# Patient Record
Sex: Female | Born: 1995 | Race: Black or African American | Hispanic: No | Marital: Single | Smoking: Current some day smoker
Health system: Southern US, Community
[De-identification: ages and names within clinical notes are randomized; demographics above are authoritative.]

## PROBLEM LIST (undated history)

## (undated) DIAGNOSIS — J45909 Unspecified asthma, uncomplicated: Secondary | ICD-10-CM

## (undated) DIAGNOSIS — R011 Cardiac murmur, unspecified: Secondary | ICD-10-CM

---
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
## 2021-01-16 ENCOUNTER — Other Ambulatory Visit: Payer: Self-pay

## 2021-01-16 ENCOUNTER — Emergency Department (HOSPITAL_COMMUNITY)
Admission: EM | Admit: 2021-01-16 | Discharge: 2021-01-16 | Disposition: A | Discharge status: Home / Self Care | Payer: Self-pay | Attending: Emergency Medicine | Admitting: Emergency Medicine

## 2021-01-16 ENCOUNTER — Telehealth (HOSPITAL_COMMUNITY): Payer: Self-pay | Admitting: Emergency Medicine

## 2021-01-16 ENCOUNTER — Encounter (HOSPITAL_COMMUNITY): Payer: Self-pay

## 2021-01-16 ENCOUNTER — Emergency Department (HOSPITAL_COMMUNITY): Payer: Self-pay

## 2021-01-16 DIAGNOSIS — W540XXA Bitten by dog, initial encounter: Secondary | ICD-10-CM | POA: Insufficient documentation

## 2021-01-16 DIAGNOSIS — S61451A Open bite of right hand, initial encounter: Secondary | ICD-10-CM | POA: Insufficient documentation

## 2021-01-16 DIAGNOSIS — F17290 Nicotine dependence, other tobacco product, uncomplicated: Secondary | ICD-10-CM | POA: Insufficient documentation

## 2021-01-16 MED ORDER — AMOXICILLIN-POT CLAVULANATE 875-125 MG PO TABS: 1.0000 | ORAL_TABLET | Freq: Two times a day (BID) | ORAL | 0 refills | Status: DC

## 2021-01-16 MED ORDER — AMOXICILLIN-POT CLAVULANATE 875-125 MG PO TABS
1.0000 | ORAL_TABLET | Freq: Once | ORAL | Status: AC
  Administered 2021-01-16: 1 via ORAL
  Filled 2021-01-16: qty 1

## 2021-01-16 MED ORDER — OXYCODONE-ACETAMINOPHEN 5-325 MG PO TABS
1.0000 | ORAL_TABLET | Freq: Once | ORAL | Status: AC
  Administered 2021-01-16: 1 via ORAL
  Filled 2021-01-16: qty 1

## 2021-01-16 MED ORDER — BACITRACIN ZINC 500 UNIT/GM EX OINT
TOPICAL_OINTMENT | CUTANEOUS | Status: AC
  Filled 2021-01-16: qty 0.9

## 2021-01-16 MED ORDER — AMOXICILLIN-POT CLAVULANATE 875-125 MG PO TABS: 1.0000 | ORAL_TABLET | Freq: Two times a day (BID) | ORAL | 0 refills | Status: AC

## 2021-01-16 MED ORDER — LIDOCAINE-EPINEPHRINE 2 %-1:100000 IJ SOLN
20.0000 mL | Freq: Once | INTRAMUSCULAR | Status: AC
  Administered 2021-01-16: 20 mL
  Filled 2021-01-16: qty 1

## 2021-01-16 NOTE — Telephone Encounter (Signed)
This patient returns to the emergency department saying that her prescription was not signed or excepted by the pharmacy.  She was seen overnight for dog bite, the prescription of Augmentin is appropriate.  Nurse Abby spoke directly with the patient.  I was able to resend a prescription of Augmentin electronically to the [facility] of her choice.  Patient was educated about this medication.

## 2021-01-16 NOTE — ED Provider Notes (Signed)
[facility]-EMERGENCY DEPT Provider Note   CSN: [id number] Arrival date & time: 01/16/21  0431     History Chief Complaint  Patient presents with  . Animal Bite    Emily Manning is a 25 y.o. female.  HPI     This is a 25 year old female who presents with a dog bite.  Patient reports that she was trying to get a toy out of her dog's mouth when her dog bit her on her right hand.  She is right-hand dominant.  Dog is up-to-date on his vaccinations including rabies.  Patient reports she has had a tetanus shot within the last 5 years.  She is reporting 8 out of 10 pain in the right hand.  No other injury.  History reviewed. No pertinent past medical history.  There are no problems to display for this patient.   History reviewed. No pertinent surgical history.   OB History   No obstetric history on file.     No family history on file.  Social History   Tobacco Use  . Smoking status: Current Some Day Smoker    Types: Cigars  . Smokeless tobacco: Never Used  Substance Use Topics  . Alcohol use: Not Currently  . Drug use: Yes    Types: Marijuana    Home Medications Prior to Admission medications   Medication Sig Start Date End Date Taking? Authorizing Provider  amoxicillin-clavulanate (AUGMENTIN) 875-125 MG tablet Take 1 tablet by mouth every 12 (twelve) hours. 01/16/21  Yes Emilly Lavey, Mayer Masker, MD  cyclobenzaprine (FLEXERIL) 5 MG tablet Take 1 tablet (5 mg total) by mouth at bedtime as needed for muscle spasms. 02/14/15   Rodolph Bong, MD  naproxen (NAPROSYN) 375 MG tablet Take 1 tablet (375 mg total) by mouth 2 (two) times daily. 02/14/15   Rodolph Bong, MD    Allergies    Patient has no known allergies.  Review of Systems   Review of Systems  Musculoskeletal:       Hand pain  Skin: Positive for wound.  Neurological: Negative for weakness and numbness.  All other systems reviewed and are negative.   Physical Exam Updated Vital  Signs BP (!) 112/100 (BP Location: Left Arm)   Pulse 64   Temp 98.2 F (36.8 C) (Oral)   Resp 18   Ht 1.524 m (5')   Wt 94.8 kg   LMP 12/19/2020   SpO2 100%   BMI 40.82 kg/m   Physical Exam Vitals and nursing note reviewed.  Constitutional:      Appearance: She is well-developed. She is obese.  HENT:     Head: Normocephalic and atraumatic.  Eyes:     Pupils: Pupils are equal, round, and reactive to light.  Cardiovascular:     Rate and Rhythm: Normal rate and regular rhythm.  Pulmonary:     Effort: Pulmonary effort is normal. No respiratory distress.  Musculoskeletal:     Cervical back: Neck supple.     Comments: Focused examination of the right hand swelling of the thumb and thenar eminence, flexion and extension of the thumb intact  Skin:    General: Skin is warm and dry.     Comments: 4 cm bite/laceration over the thenar eminence with subcu tissue exposed, no foreign body punctate bite mark over the dorsum of the thumb at the MCP, no foreign body palpated, superficial bite/abrasion over the dorsum of the thumb including the base of the nail, there is a small subungual  hematoma  Neurological:     Mental Status: She is alert and oriented to person, place, and time.  Psychiatric:        Mood and Affect: Mood normal.     ED Results / Procedures / Treatments   Labs (all labs ordered are listed, but only abnormal results are displayed) Labs Reviewed - No data to display  EKG None  Radiology DG Hand Complete Right  Result Date: 01/16/2021 CLINICAL DATA:  25 year old female status post dog bite this morning. Puncture wounds to posterior and palmar surface near base of thumb. EXAM: RIGHT HAND - COMPLETE 3+ VIEW COMPARISON:  None. FINDINGS: Soft tissue swelling and heterogeneity with epicenter in the webspace between the 1st and 2nd metacarpals. No radiopaque foreign body identified. No tracking soft tissue gas. Underlying normal bone mineralization. Joint spaces appear  normal. No fracture or dislocation. No osseous abnormality identified. IMPRESSION: Soft tissue swelling/injury with no radiopaque foreign body or osseous abnormality identified. Electronically Signed   By: Odessa Fleming M.D.   On: 01/16/2021 05:13    Procedures .Marland KitchenLaceration Repair  Date/Time: 01/16/2021 5:41 AM Performed by: Shon Baton, MD Authorized by: Shon Baton, MD   Consent:    Consent obtained:  Verbal   Consent given by:  Patient   Risks discussed:  Infection, pain and poor cosmetic result   Alternatives discussed:  No treatment Anesthesia:    Anesthesia method:  Local infiltration   Local anesthetic:  Lidocaine 2% WITH epi Laceration details:    Location:  Hand   Hand location:  R palm   Length (cm):  4   Depth (mm):  5 Pre-procedure details:    Preparation:  Patient was prepped and draped in usual sterile fashion Exploration:    Limited defect created (wound extended): no     Hemostasis achieved with:  Direct pressure   Imaging outcome: foreign body noted     Wound exploration: wound explored through full range of motion and entire depth of wound visualized     Wound extent: no foreign bodies/material noted, no nerve damage noted and no tendon damage noted     Contaminated: no   Treatment:    Area cleansed with:  Povidone-iodine   Amount of cleaning:  Extensive   Irrigation solution:  Sterile saline   Visualized foreign bodies/material removed: no     Debridement:  Minimal   Undermining:  None   Scar revision: no   Skin repair:    Repair method:  Sutures   Suture size:  4-0   Suture material:  Nylon   Suture technique:  Simple interrupted   Number of sutures:  3 Approximation:    Approximation:  Loose Repair type:    Repair type:  Simple Post-procedure details:    Dressing:  Antibiotic ointment   Procedure completion:  Tolerated     Medications Ordered in ED Medications  oxyCODONE-acetaminophen (PERCOCET/ROXICET) 5-325 MG per tablet 1 tablet  (1 tablet Oral Given 01/16/21 0456)  amoxicillin-clavulanate (AUGMENTIN) 875-125 MG per tablet 1 tablet (1 tablet Oral Given 01/16/21 0456)  lidocaine-EPINEPHrine (XYLOCAINE W/EPI) 2 %-1:100000 (with pres) injection 20 mL (20 mLs Infiltration Given by Other 01/16/21 3875)    ED Course  I have reviewed the triage vital signs and the nursing notes.  Pertinent labs & imaging results that were available during my care of the patient were reviewed by me and considered in my medical decision making (see chart for details).    MDM Rules/Calculators/A&P  Patient presents with a dog bite to her dominant hand.  This is her own animal and it appears to be a provoked bite by taking away one of the dog's toys.  Reports animal is up-to-date on rabies.  She is up-to-date on tetanus.  Largest bite is over the thenar eminence of the hand.  Flexion and extension of the thumb intact.  Bites were soaked in saline and Betadine.  They were copiously irrigated.  Laceration repair of the bite on the thenar eminence was pursued given extent.  This was loosely approximated.  Otherwise all wounds were clean.  Patient advised to monitor closely for signs and symptoms of infection.  To be discharged with Augmentin.  After history, exam, and medical workup I feel the patient has been appropriately medically screened and is safe for discharge home. Pertinent diagnoses were discussed with the patient. Patient was given return precautions.  Final Clinical Impression(s) / ED Diagnoses Final diagnoses:  Dog bite, initial encounter    Rx / DC Orders ED Discharge Orders         Ordered    amoxicillin-clavulanate (AUGMENTIN) 875-125 MG tablet  Every 12 hours        01/16/21 0537           Cloyce Blankenhorn, Mayer Masker, MD 01/16/21 [phone]

## 2021-01-16 NOTE — ED Triage Notes (Signed)
Pt reports her dog bit her this morning. All vaccines up to date. Puncture wounds on right hand.

## 2021-01-16 NOTE — Discharge Instructions (Signed)
You were seen today for dog bite.  Monitor closely for signs and symptoms of infection including redness, drainage from the sites.  You will need removal of your stitches in 7 to 10 days.  Apply antibiotic ointment and do not submerge.

## 2021-02-04 ENCOUNTER — Encounter (HOSPITAL_COMMUNITY): Payer: Self-pay

## 2021-02-04 ENCOUNTER — Other Ambulatory Visit: Payer: Self-pay

## 2021-02-04 ENCOUNTER — Ambulatory Visit (HOSPITAL_COMMUNITY)
Admission: EM | Admit: 2021-02-04 | Discharge: 2021-02-04 | Disposition: A | Discharge status: Home / Self Care | Payer: Self-pay | Attending: Emergency Medicine | Admitting: Emergency Medicine

## 2021-02-04 DIAGNOSIS — S61451D Open bite of right hand, subsequent encounter: Secondary | ICD-10-CM

## 2021-02-04 DIAGNOSIS — W540XXD Bitten by dog, subsequent encounter: Secondary | ICD-10-CM

## 2021-02-04 MED ORDER — AMOXICILLIN-POT CLAVULANATE 875-125 MG PO TABS: 1.0000 | ORAL_TABLET | Freq: Two times a day (BID) | ORAL | 0 refills | Status: DC

## 2021-02-04 NOTE — ED Provider Notes (Signed)
[facility]    CSN: [id number] Arrival date & time: 02/04/21  1348      History   Chief Complaint Chief Complaint  Patient presents with  . Wound Check    HPI Emily Manning is a 25 y.o. female.   Patient presents with swelling and pain to right thumb for three days. Had dog bite on 01/16/21. Received treatment and sutures on 4/6. Sutures still present. Did not complete antibiotics. Unable to bend thumb. Denies numbness and tingling.  History reviewed. No pertinent past medical history.  There are no problems to display for this patient.   History reviewed. No pertinent surgical history.  OB History   No obstetric history on file.      Home Medications    Prior to Admission medications   Medication Sig Start Date End Date Taking? Authorizing Provider  amoxicillin-clavulanate (AUGMENTIN) 875-125 MG tablet Take 1 tablet by mouth every 12 (twelve) hours. 02/04/21  Yes Tayshun Gappa, Elita Boone, NP  cyclobenzaprine (FLEXERIL) 5 MG tablet Take 1 tablet (5 mg total) by mouth at bedtime as needed for muscle spasms. 02/14/15   Rodolph Bong, MD  naproxen (NAPROSYN) 375 MG tablet Take 1 tablet (375 mg total) by mouth 2 (two) times daily. 02/14/15   Rodolph Bong, MD    Family History History reviewed. No pertinent family history.  Social History Social History   Tobacco Use  . Smoking status: Current Some Day Smoker    Types: Cigars  . Smokeless tobacco: Never Used  Substance Use Topics  . Alcohol use: Not Currently  . Drug use: Yes    Types: Marijuana     Allergies   Patient has no known allergies.   Review of Systems Review of Systems  Constitutional: Negative.   Respiratory: Negative.   Cardiovascular: Negative.   Musculoskeletal: Positive for joint swelling. Negative for arthralgias, back pain, gait problem, myalgias, neck pain and neck stiffness.  Skin: Negative.   Neurological: Negative.      Physical Exam Triage Vital Signs ED Triage Vitals   Enc Vitals Group     BP 02/04/21 1448 114/68     Pulse Rate 02/04/21 1448 84     Resp 02/04/21 1448 18     Temp 02/04/21 1448 98.6 F (37 C)     Temp Source 02/04/21 1448 Oral     SpO2 02/04/21 1448 96 %     Weight --      Height --      Head Circumference --      Peak Flow --      Pain Score 02/04/21 1447 6     Pain Loc --      Pain Edu? --      Excl. in GC? --    No data found.  Updated Vital Signs BP 114/68 (BP Location: Left Arm)   Pulse 84   Temp 98.6 F (37 C) (Oral)   Resp 18   LMP 01/21/2021 (Exact Date)   SpO2 96%   Visual Acuity Right Eye Distance:   Left Eye Distance:   Bilateral Distance:    Right Eye Near:   Left Eye Near:    Bilateral Near:     Physical Exam Constitutional:      Appearance: Normal appearance. She is normal weight.  HENT:     Head: Normocephalic.  Eyes:     Extraocular Movements: Extraocular movements intact.  Pulmonary:     Effort: Pulmonary effort is normal.  Musculoskeletal:  Comments: Unable to bend right thumb and mildly swollen. Tenderness over sutures, no erythema, no edema, no drainage present    Skin:    General: Skin is warm and dry.  Neurological:     General: No focal deficit present.     Mental Status: She is alert and oriented to person, place, and time. Mental status is at baseline.  Psychiatric:        Mood and Affect: Mood normal.        Behavior: Behavior normal.        Thought Content: Thought content normal.        Judgment: Judgment normal.      UC Treatments / Results  Labs (all labs ordered are listed, but only abnormal results are displayed) Labs Reviewed - No data to display  EKG   Radiology No results found.  Procedures Procedures (including critical care time)  Medications Ordered in UC Medications - No data to display  Initial Impression / Assessment and Plan / UC Course  I have reviewed the triage vital signs and the nursing notes.  Pertinent labs & imaging results that  were available during my care of the patient were reviewed by me and considered in my medical decision making (see chart for details).  Dog bite, subsequent encounter   1. Augment 875/125 bid for 7 days, discussed importance of completing antibiotic, verbalized understanding 2. Continue use of otc ibuprofen for pain management 3. Given Hand specialist referral due to decreased ROM after three weeks 4. Sutures removed, no signs of infection at wound site  Final Clinical Impressions(s) / UC Diagnoses   Final diagnoses:  None     Discharge Instructions     Take augmentin twice a day for 7 days, take all medication  Continue use of ibuprofen and tylenol for pain   Follow up with hand specialist for evaluation    ED Prescriptions    Medication Sig Dispense Auth. Provider   amoxicillin-clavulanate (AUGMENTIN) 875-125 MG tablet Take 1 tablet by mouth every 12 (twelve) hours. 14 tablet Veida Spira, Elita Boone, NP     PDMP not reviewed this encounter.   Valinda Hoar, NP 02/04/21 1625

## 2021-02-04 NOTE — ED Triage Notes (Signed)
Pt presents to check the wound in the right right hand and right thumb after dog bite on 01/16/2021. Pt reports swelling and pain in the right hand x 3 days.

## 2021-02-04 NOTE — Discharge Instructions (Addendum)
Take augmentin twice a day for 7 days, take all medication  Continue use of ibuprofen and tylenol for pain   Follow up with hand specialist for evaluation

## 2021-02-04 NOTE — ED Notes (Signed)
Removed 3 sutures from left hand (thumb area)

## 2022-10-27 ENCOUNTER — Encounter (HOSPITAL_COMMUNITY): Payer: Self-pay | Admitting: Emergency Medicine

## 2022-10-27 ENCOUNTER — Ambulatory Visit (HOSPITAL_COMMUNITY)
Admission: EM | Admit: 2022-10-27 | Discharge: 2022-10-27 | Disposition: A | Discharge status: Home / Self Care | Payer: Self-pay | Attending: Emergency Medicine | Admitting: Emergency Medicine

## 2022-10-27 DIAGNOSIS — B349 Viral infection, unspecified: Secondary | ICD-10-CM

## 2022-10-27 DIAGNOSIS — J029 Acute pharyngitis, unspecified: Secondary | ICD-10-CM

## 2022-10-27 HISTORY — DX: Unspecified asthma, uncomplicated: J45.909

## 2022-10-27 HISTORY — DX: Cardiac murmur, unspecified: R01.1

## 2022-10-27 MED ORDER — PREDNISONE 10 MG (21) PO TBPK: ORAL_TABLET | Freq: Every day | ORAL | 0 refills | Status: DC

## 2022-10-27 MED ORDER — FLUTICASONE PROPIONATE 50 MCG/ACT NA SUSP: 2.0000 | Freq: Every day | NASAL | 2 refills | Status: DC

## 2022-10-27 MED ORDER — NAPROXEN 375 MG PO TABS: 375.0000 mg | ORAL_TABLET | Freq: Two times a day (BID) | ORAL | 0 refills | Status: DC

## 2022-10-27 NOTE — Discharge Instructions (Addendum)
Take over-the-counter cough and cold medicines such as DayQuil and NyQuil Your symptoms are more viral in nature such as a cold Antibiotics are not needed at this time use a humidifier to help with congestion

## 2022-10-27 NOTE — ED Provider Notes (Signed)
[facility]    CSN: [id number] Arrival date & time: 10/27/22  1106      History   Chief Complaint Chief Complaint  Patient presents with   Sore Throat   Emesis    HPI Emily Manning is a 26 y.o. female.   Patient presents today with sore throat  congestion for the past 3 days.  Denies any shortness of breath no chest pain.  Unknown of a fever.  Taking over-the-counter elderberry and honey with minimal relief.  Patient is eating and drinking well.    Past Medical History:  Diagnosis Date   Asthma    Heart murmur     There are no problems to display for this patient.   History reviewed. No pertinent surgical history.  OB History   No obstetric history on file.      Home Medications    Prior to Admission medications   Medication Sig Start Date End Date Taking? Authorizing Provider  fluticasone (FLONASE) 50 MCG/ACT nasal spray Place 2 sprays into both nostrils daily. 10/27/22  Yes Marney Setting, NP  predniSONE (STERAPRED UNI-PAK 21 TAB) 10 MG (21) TBPK tablet Take by mouth daily. Take 6 tabs by mouth daily  for 2 days, then 5 tabs for 2 days, then 4 tabs for 2 days, then 3 tabs for 2 days, 2 tabs for 2 days, then 1 tab by mouth daily for 2 days 10/27/22  Yes Marney Setting, NP  amoxicillin-clavulanate (AUGMENTIN) 875-125 MG tablet Take 1 tablet by mouth every 12 (twelve) hours. 02/04/21   White, Leitha Schuller, NP  cyclobenzaprine (FLEXERIL) 5 MG tablet Take 1 tablet (5 mg total) by mouth at bedtime as needed for muscle spasms. 02/14/15   Gregor Hams, MD  naproxen (NAPROSYN) 375 MG tablet Take 1 tablet (375 mg total) by mouth 2 (two) times daily. 10/27/22   Marney Setting, NP    Family History History reviewed. No pertinent family history.  Social History Social History   Tobacco Use   Smoking status: Some Days    Types: Cigars   Smokeless tobacco: Never  Substance Use Topics   Alcohol use: Not Currently   Drug use: Yes    Types:  Marijuana     Allergies   Patient has no known allergies.   Review of Systems Review of Systems  Constitutional:  Negative for chills and fever.  HENT:  Positive for congestion, postnasal drip, rhinorrhea, sinus pressure, sinus pain and sore throat. Negative for ear pain, facial swelling and trouble swallowing.   Eyes: Negative.   Respiratory: Negative.    Cardiovascular: Negative.   Gastrointestinal: Negative.   Genitourinary: Negative.   Musculoskeletal: Negative.   Neurological: Negative.      Physical Exam Triage Vital Signs ED Triage Vitals [10/27/22 1251]  Enc Vitals Group     BP 130/86     Pulse Rate 82     Resp 16     Temp 98 F (36.7 C)     Temp Source Oral     SpO2 98 %     Weight      Height      Head Circumference      Peak Flow      Pain Score 5     Pain Loc      Pain Edu?      Excl. in [place]?    No data found.  Updated Vital Signs BP 130/86 (BP Location: Left Arm)  Pulse 82   Temp 98 F (36.7 C) (Oral)   Resp 16   LMP 10/13/2022   SpO2 98%   Visual Acuity Right Eye Distance:   Left Eye Distance:   Bilateral Distance:    Right Eye Near:   Left Eye Near:    Bilateral Near:     Physical Exam Constitutional:      Appearance: She is well-developed.  HENT:     Right Ear: Tympanic membrane normal.     Left Ear: Tympanic membrane normal.     Nose: Congestion and rhinorrhea present.     Mouth/Throat:     Mouth: Mucous membranes are moist. No oral lesions.     Pharynx: Posterior oropharyngeal erythema present. No oropharyngeal exudate or uvula swelling.     Tonsils: No tonsillar exudate or tonsillar abscesses. 1+ on the right. 1+ on the left.  Eyes:     Conjunctiva/sclera: Conjunctivae normal.  Cardiovascular:     Rate and Rhythm: Normal rate.  Pulmonary:     Effort: Pulmonary effort is normal.  Abdominal:     Palpations: Abdomen is soft.  Musculoskeletal:     Cervical back: Normal range of motion.  Skin:    General: Skin is warm.   Neurological:     Mental Status: She is alert.      UC Treatments / Results  Labs (all labs ordered are listed, but only abnormal results are displayed) Labs Reviewed - No data to display  EKG   Radiology No results found.  Procedures Procedures (including critical care time)  Medications Ordered in UC Medications - No data to display  Initial Impression / Assessment and Plan / UC Course  I have reviewed the triage vital signs and the nursing notes.  Pertinent labs & imaging results that were available during my care of the patient were reviewed by me and considered in my medical decision making (see chart for details).     Patient has symptoms more viral in nature no antibiotic is needed at this time Patient should take over-the-counter DayQuil NyQuil as needed Use a humidifier to help with nasal congestion Symptoms may persist for several days  Final Clinical Impressions(s) / UC Diagnoses   Final diagnoses:  Sore throat  Viral illness     Discharge Instructions      Take over-the-counter cough and cold medicines such as DayQuil and NyQuil Your symptoms are more viral in nature such as a cold Antibiotics are not needed at this time use a humidifier to help with congestion      ED Prescriptions     Medication Sig Dispense Auth. Provider   fluticasone (FLONASE) 50 MCG/ACT nasal spray Place 2 sprays into both nostrils daily. 16 g Morley Kos L, NP   naproxen (NAPROSYN) 375 MG tablet Take 1 tablet (375 mg total) by mouth 2 (two) times daily. 20 tablet Morley Kos L, NP   predniSONE (STERAPRED UNI-PAK 21 TAB) 10 MG (21) TBPK tablet Take by mouth daily. Take 6 tabs by mouth daily  for 2 days, then 5 tabs for 2 days, then 4 tabs for 2 days, then 3 tabs for 2 days, 2 tabs for 2 days, then 1 tab by mouth daily for 2 days 42 tablet Marney Setting, NP      PDMP not reviewed this encounter.   Marney Setting, NP 10/27/22 1308

## 2022-10-27 NOTE — ED Triage Notes (Signed)
Reports sore throat, nausea, vomiting, nasal drainage, possible fever x 3 days Co-worker sick recently. Denies cough Taking elderberry and honey at home with minimal improvement of symptoms.

## 2023-02-08 IMAGING — CR DG HAND COMPLETE 3+V*R*
3 series · 3 of 3 positions shown · non-contrast
Comparison: None.

CLINICAL DATA: 24-year-old female status post dog bite this
morning. Puncture wounds to posterior and palmar surface near base
of thumb.

EXAM:
RIGHT HAND - COMPLETE 3+ VIEW

[x hand pa right]
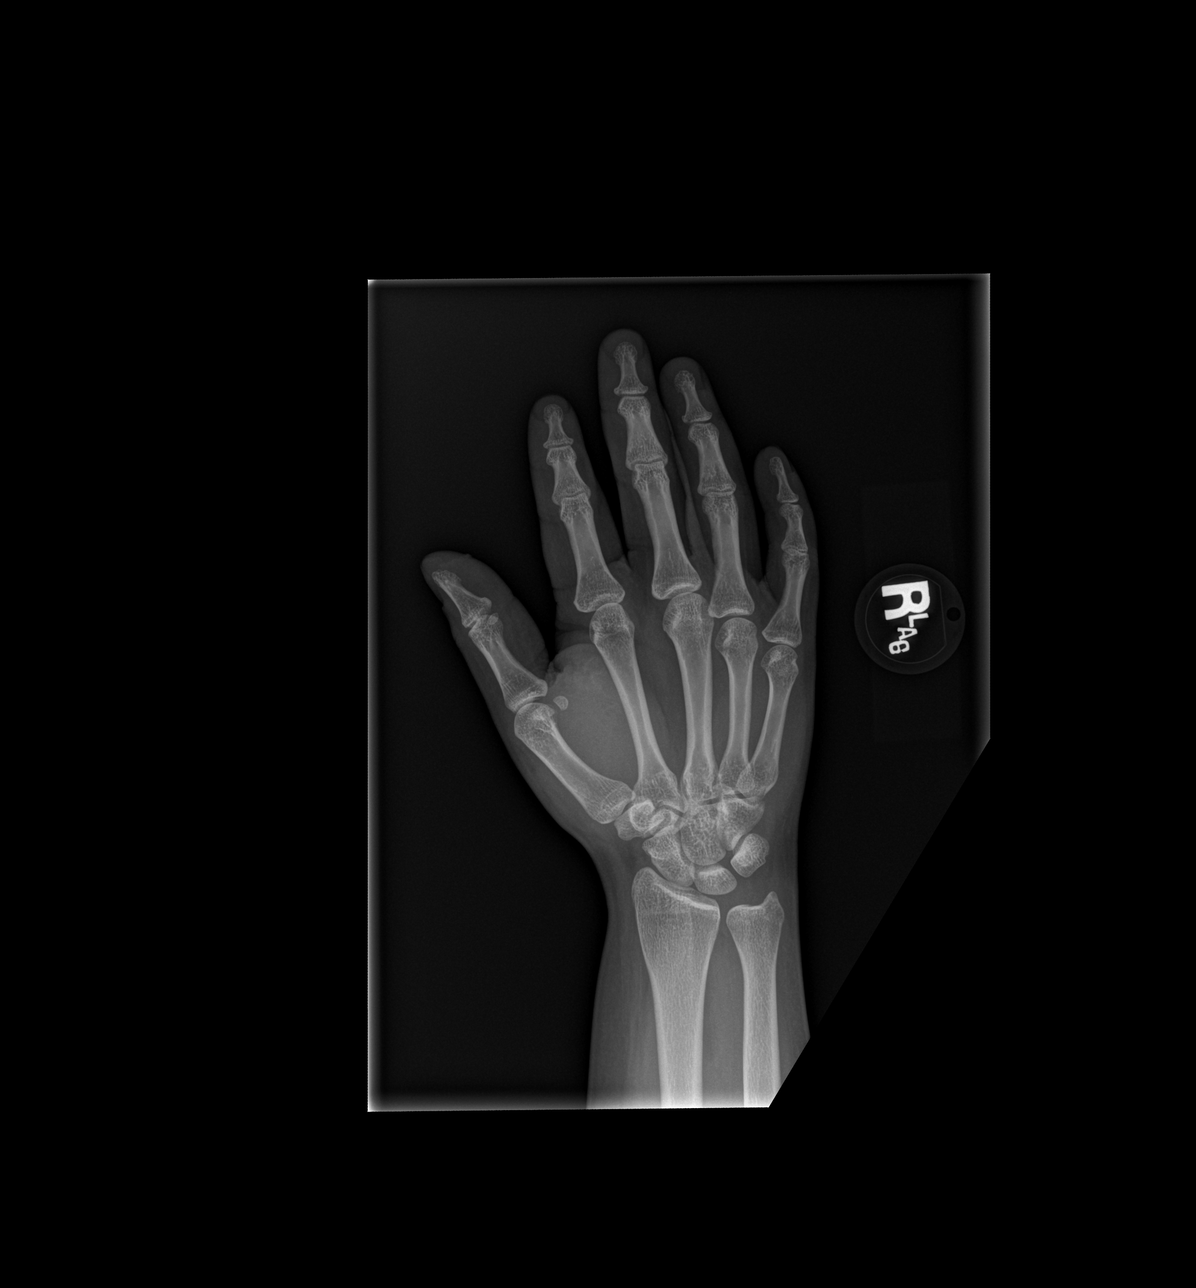

[x hand obl right]
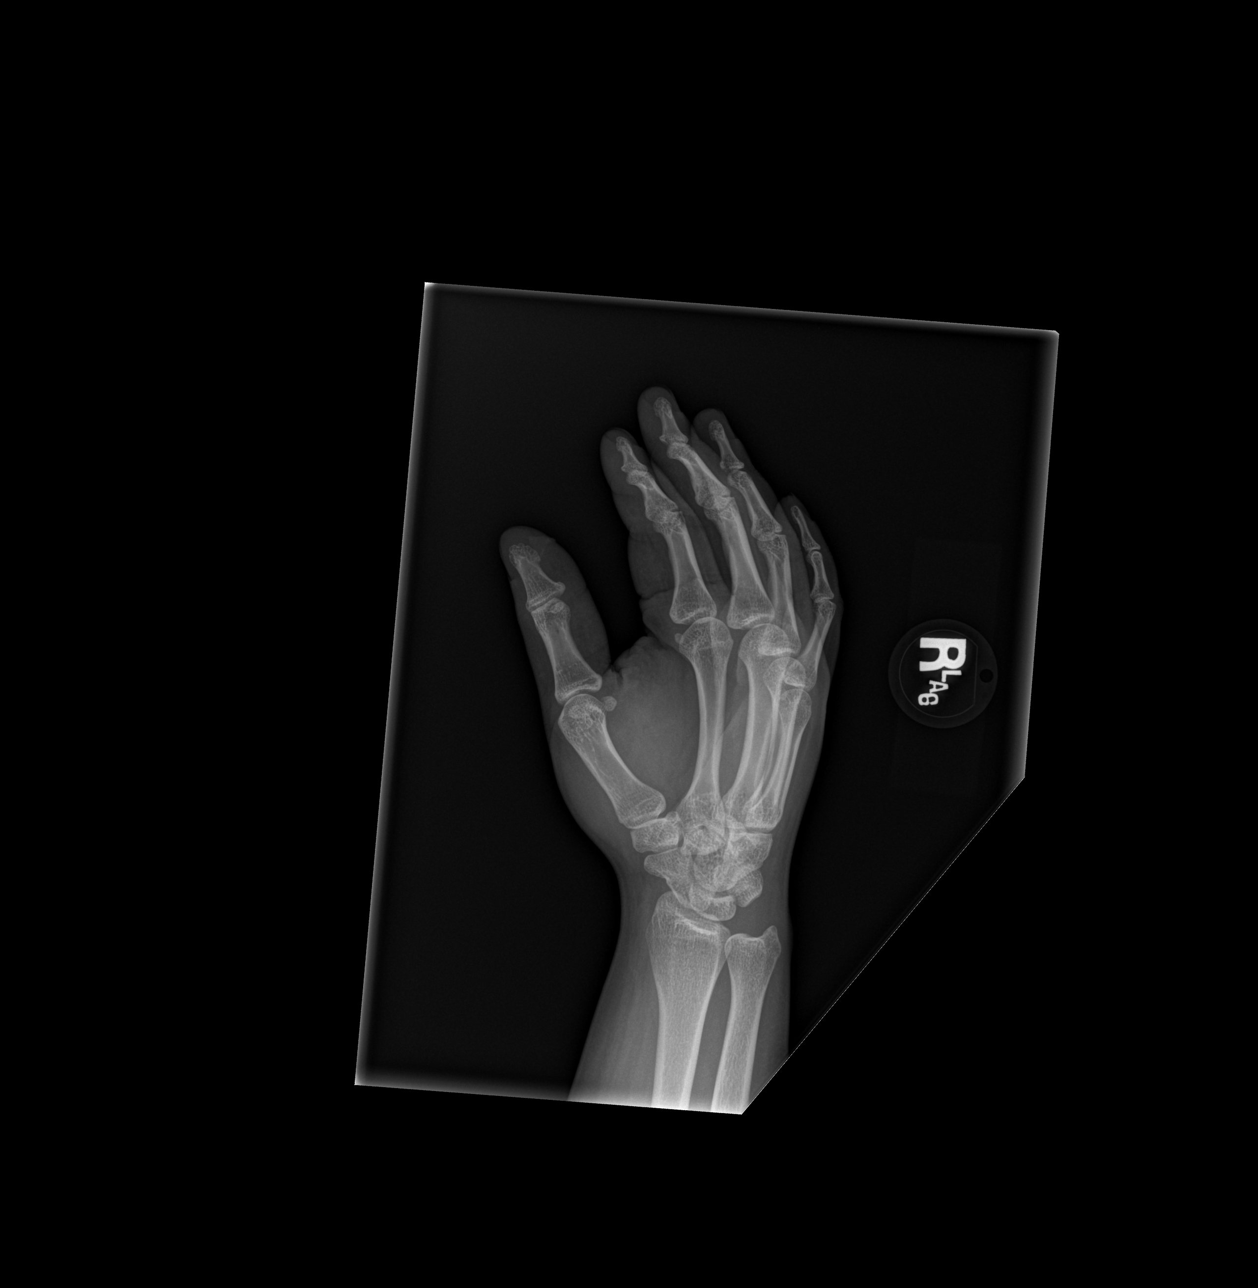

[x hand lat right]
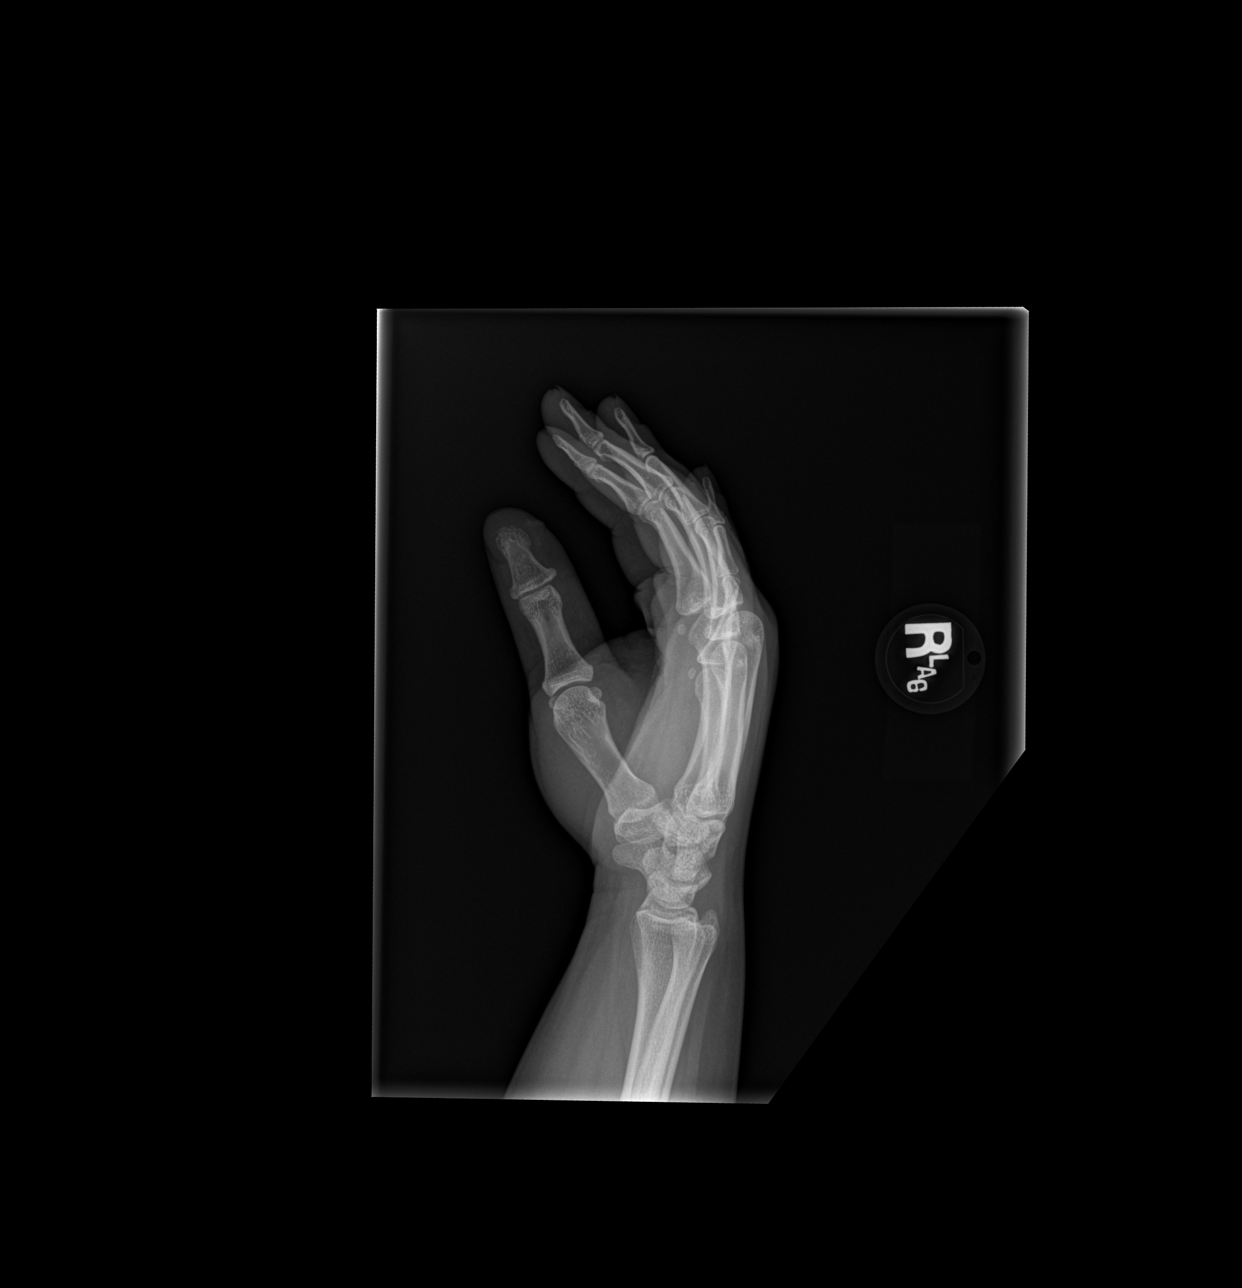

[3 of 3 positions shown; findings below may reference images not displayed]

FINDINGS: Soft tissue swelling and heterogeneity with epicenter in the
webspace between the 1st and 2nd metacarpals. No radiopaque foreign
body identified. No tracking soft tissue gas.

Underlying normal bone mineralization. Joint spaces appear normal.
No fracture or dislocation. No osseous abnormality identified.
IMPRESSION: Soft tissue swelling/injury with no radiopaque foreign body or
osseous abnormality identified.

## 2023-02-14 ENCOUNTER — Encounter (HOSPITAL_COMMUNITY): Payer: Self-pay | Admitting: Emergency Medicine

## 2023-02-14 ENCOUNTER — Other Ambulatory Visit: Payer: Self-pay

## 2023-02-14 ENCOUNTER — Ambulatory Visit (HOSPITAL_COMMUNITY)
Admission: EM | Admit: 2023-02-14 | Discharge: 2023-02-14 | Disposition: A | Discharge status: Home / Self Care | Payer: Medicaid Other | Attending: Emergency Medicine | Admitting: Emergency Medicine

## 2023-02-14 DIAGNOSIS — N898 Other specified noninflammatory disorders of vagina: Secondary | ICD-10-CM | POA: Insufficient documentation

## 2023-02-14 DIAGNOSIS — Z113 Encounter for screening for infections with a predominantly sexual mode of transmission: Secondary | ICD-10-CM | POA: Insufficient documentation

## 2023-02-14 LAB — POCT URINALYSIS DIP (MANUAL ENTRY)
Bilirubin, UA: NEGATIVE
Blood, UA: NEGATIVE
Glucose, UA: NEGATIVE mg/dL
Ketones, POC UA: NEGATIVE mg/dL
Leukocytes, UA: NEGATIVE
Nitrite, UA: NEGATIVE
Protein Ur, POC: NEGATIVE mg/dL
Spec Grav, UA: >=1.03 — AB (ref 1.010–1.025)
Urobilinogen, UA: 1 E.U./dL
pH, UA: 6 (ref 5.0–8.0)

## 2023-02-14 LAB — POCT URINE PREGNANCY: Preg Test, Ur: NEGATIVE

## 2023-02-14 LAB — HIV ANTIBODY (ROUTINE TESTING W REFLEX): HIV Screen 4th Generation wRfx: NONREACTIVE

## 2023-02-14 NOTE — ED Triage Notes (Signed)
Symptoms started 02/11/2023.  Symptoms include an "irritable discomfort...itchiness"  reports a vaginal discharge.  Notes having to urinate more often.  Denies abdominal pain, denies back pain  Denies use of any medications

## 2023-02-14 NOTE — Discharge Instructions (Addendum)
We will call you if anything on your swab or blood work returns positive.  You will also be able to see these results on MyChart. Please abstain from sexual intercourse until your results return.  You can return with any concerns

## 2023-02-14 NOTE — ED Provider Notes (Addendum)
[facility]    CSN: [id number] Arrival date & time: 02/14/23  1005     History   Chief Complaint Chief Complaint  Patient presents with   Vaginal Discharge    HPI Emily Manning is a 27 y.o. female.  3 day history of vaginal discomfort and itching Some discharge. No odor or color change Reports urinary frequency over the last few days, no dysuria or urgency. Denies flank pain or fever No NVD or abdominal pain  LMP 4/20  Would like STD testing   Past Medical History:  Diagnosis Date   Asthma    Heart murmur     There are no problems to display for this patient.   History reviewed. No pertinent surgical history.  OB History   No obstetric history on file.      Home Medications    Prior to Admission medications   Not on File    Family History History reviewed. No pertinent family history.  Social History Social History   Tobacco Use   Smoking status: Some Days    Types: Cigars   Smokeless tobacco: Never  Vaping Use   Vaping Use: Every day  Substance Use Topics   Alcohol use: Not Currently   Drug use: Yes    Types: Marijuana     Allergies   Patient has no known allergies.   Review of Systems Review of Systems As per HPI  Physical Exam Triage Vital Signs ED Triage Vitals  Enc Vitals Group     BP 02/14/23 1041 109/80     Pulse Rate 02/14/23 1041 86     Resp 02/14/23 1041 16     Temp 02/14/23 1041 98.2 F (36.8 C)     Temp Source 02/14/23 1041 Oral     SpO2 02/14/23 1041 98 %     Weight --      Height --      Head Circumference --      Peak Flow --      Pain Score 02/14/23 1038 0     Pain Loc --      Pain Edu? --      Excl. in [place]? --    No data found.  Updated Vital Signs BP 109/80 (BP Location: Left Arm) Comment (BP Location): large cuff  Pulse 86   Temp 98.2 F (36.8 C) (Oral)   Resp 16   LMP 01/31/2023   SpO2 98%     Physical Exam Vitals and nursing note reviewed.  Constitutional:      General:  She is not in acute distress.    Appearance: Normal appearance.  HENT:     Mouth/Throat:     Pharynx: Oropharynx is clear.  Cardiovascular:     Rate and Rhythm: Normal rate and regular rhythm.     Pulses: Normal pulses.     Heart sounds: Normal heart sounds.  Pulmonary:     Effort: Pulmonary effort is normal.     Breath sounds: Normal breath sounds.  Abdominal:     Palpations: Abdomen is soft.     Tenderness: There is no abdominal tenderness.  Neurological:     Mental Status: She is alert and oriented to person, place, and time.     UC Treatments / Results  Labs (all labs ordered are listed, but only abnormal results are displayed) Labs Reviewed  POCT URINALYSIS DIP (MANUAL ENTRY) - Abnormal; Notable for the following components:      Result Value  Spec Grav, UA >=1.030 (*)    All other components within normal limits  HIV ANTIBODY (ROUTINE TESTING W REFLEX)  RPR  POCT URINE PREGNANCY  CERVICOVAGINAL ANCILLARY ONLY    EKG  Radiology No results found.  Procedures Procedures (including critical care time)  Medications Ordered in UC Medications - No data to display  Initial Impression / Assessment and Plan / UC Course  I have reviewed the triage vital signs and the nursing notes.  Pertinent labs & imaging results that were available during my care of the patient were reviewed by me and considered in my medical decision making (see chart for details).  UPT negative UA unremarkable apart from elevated specific gravity.  No sign of infection at this time.  Discussed increasing water intake to at least 64 ounces daily Cytology swab, HIV/RPR pending, treat positive result if indicated No questions at this time  Final Clinical Impressions(s) / UC Diagnoses   Final diagnoses:  Vaginal discharge  Screen for STD (sexually transmitted disease)     Discharge Instructions      We will call you if anything on your swab or blood work returns positive.  You will also  be able to see these results on MyChart. Please abstain from sexual intercourse until your results return.  You can return with any concerns      ED Prescriptions   None    PDMP not reviewed this encounter.   Tu Shimmel, Ray Church 02/14/23 1217    Dametra Whetsel, Lurena Joiner, PA-C 02/14/23 1218

## 2023-02-15 LAB — RPR: RPR Ser Ql: NONREACTIVE

## 2023-02-16 ENCOUNTER — Telehealth (HOSPITAL_COMMUNITY): Payer: Self-pay | Admitting: Emergency Medicine

## 2023-02-16 LAB — CERVICOVAGINAL ANCILLARY ONLY
Bacterial Vaginitis (gardnerella): POSITIVE — AB
Candida Glabrata: NEGATIVE
Candida Vaginitis: NEGATIVE
Chlamydia: NEGATIVE
Comment: NEGATIVE
Comment: NEGATIVE
Comment: NEGATIVE
Comment: NEGATIVE
Comment: NEGATIVE
Comment: NORMAL
Neisseria Gonorrhea: NEGATIVE
Trichomonas: NEGATIVE

## 2023-02-16 MED ORDER — METRONIDAZOLE 500 MG PO TABS: 500.0000 mg | ORAL_TABLET | Freq: Two times a day (BID) | ORAL | 0 refills | Status: DC

## 2023-03-24 ENCOUNTER — Other Ambulatory Visit: Payer: Self-pay

## 2023-03-24 ENCOUNTER — Encounter (HOSPITAL_COMMUNITY): Payer: Self-pay | Admitting: Emergency Medicine

## 2023-03-24 ENCOUNTER — Ambulatory Visit (HOSPITAL_COMMUNITY)
Admission: EM | Admit: 2023-03-24 | Discharge: 2023-03-24 | Disposition: A | Discharge status: Home / Self Care | Payer: Medicaid Other | Attending: Family Medicine | Admitting: Family Medicine

## 2023-03-24 DIAGNOSIS — N939 Abnormal uterine and vaginal bleeding, unspecified: Secondary | ICD-10-CM | POA: Insufficient documentation

## 2023-03-24 LAB — CBC
HCT: 36.2 % (ref 36.0–46.0)
Hemoglobin: 11 g/dL — ABNORMAL LOW (ref 12.0–15.0)
MCH: 25.2 pg — ABNORMAL LOW (ref 26.0–34.0)
MCHC: 30.4 g/dL (ref 30.0–36.0)
MCV: 82.8 fL (ref 80.0–100.0)
Platelets: 280 10*3/uL (ref 150–400)
RBC: 4.37 MIL/uL (ref 3.87–5.11)
RDW: 15.9 % — ABNORMAL HIGH (ref 11.5–15.5)
WBC: 6 10*3/uL (ref 4.0–10.5)
nRBC: 0 % (ref 0.0–0.2)

## 2023-03-24 LAB — POCT URINE PREGNANCY: Preg Test, Ur: NEGATIVE

## 2023-03-24 MED ORDER — MEFENAMIC ACID 250 MG PO CAPS: 500.0000 mg | ORAL_CAPSULE | Freq: Three times a day (TID) | ORAL | 0 refills | Status: AC

## 2023-03-24 NOTE — ED Provider Notes (Signed)
[facility]    CSN: [id number] Arrival date & time: 03/24/23  4098      History   Chief Complaint Chief Complaint  Patient presents with   Vaginal Bleeding    HPI Emily Manning is a 27 y.o. female.    Vaginal Bleeding  Here for vaginal bleeding.  On May 19 she began having what was most likely going to be a normal period time for her.  Bleeding at that point was light.  Then she stopped bleeding for about a day and then she has been bleeding pretty heavily ever since May 21.  The first 4 days of the bleeding she did have some menstrual cramps, but she has not been having any since.  No fever or dysuria.  No dizziness.  No allergies to medications  Past Medical History:  Diagnosis Date   Asthma    Heart murmur     There are no problems to display for this patient.   History reviewed. No pertinent surgical history.  OB History   No obstetric history on file.      Home Medications    Prior to Admission medications   Medication Sig Start Date End Date Taking? Authorizing Provider  Mefenamic Acid 250 MG CAPS Take 2 capsules (500 mg total) by mouth in the morning, at noon, and at bedtime for 5 days. 03/24/23 03/29/23 Yes Zenia Resides, MD    Family History History reviewed. No pertinent family history.  Social History Social History   Tobacco Use   Smoking status: Some Days    Types: Cigars   Smokeless tobacco: Never  Vaping Use   Vaping Use: Every day  Substance Use Topics   Alcohol use: Not Currently   Drug use: Yes    Types: Marijuana     Allergies   Patient has no known allergies.   Review of Systems Review of Systems  Genitourinary:  Positive for vaginal bleeding.     Physical Exam Triage Vital Signs ED Triage Vitals  Enc Vitals Group     BP 03/24/23 0939 101/72     Pulse Rate 03/24/23 0939 79     Resp 03/24/23 0939 18     Temp 03/24/23 0939 97.9 F (36.6 C)     Temp Source 03/24/23 0939 Oral     SpO2  03/24/23 0939 98 %     Weight --      Height --      Head Circumference --      Peak Flow --      Pain Score 03/24/23 0936 0     Pain Loc --      Pain Edu? --      Excl. in [place]? --    No data found.  Updated Vital Signs BP 101/72 (BP Location: Right Arm) Comment (BP Location): large cuff  Pulse 79   Temp 97.9 F (36.6 C) (Oral)   Resp 18   LMP 03/01/2023 (Approximate)   SpO2 98%   Visual Acuity Right Eye Distance:   Left Eye Distance:   Bilateral Distance:    Right Eye Near:   Left Eye Near:    Bilateral Near:     Physical Exam Vitals reviewed.  Constitutional:      General: She is not in acute distress.    Appearance: She is not ill-appearing, toxic-appearing or diaphoretic.  HENT:     Mouth/Throat:     Mouth: Mucous membranes are moist.  Eyes:  Extraocular Movements: Extraocular movements intact.     Pupils: Pupils are equal, round, and reactive to light.  Cardiovascular:     Rate and Rhythm: Normal rate and regular rhythm.     Heart sounds: No murmur heard. Pulmonary:     Effort: Pulmonary effort is normal.     Breath sounds: Normal breath sounds.  Abdominal:     General: There is no distension.     Palpations: Abdomen is soft. There is no mass.     Tenderness: There is no guarding.     Comments: She is a little tender in the suprapubic area  Musculoskeletal:     Cervical back: Neck supple.  Lymphadenopathy:     Cervical: No cervical adenopathy.  Skin:    Coloration: Skin is not jaundiced or pale.  Neurological:     General: No focal deficit present.     Mental Status: She is alert and oriented to person, place, and time.  Psychiatric:        Behavior: Behavior normal.      UC Treatments / Results  Labs (all labs ordered are listed, but only abnormal results are displayed) Labs Reviewed  CBC  POCT URINE PREGNANCY    EKG   Radiology No results found.  Procedures Procedures (including critical care time)  Medications Ordered in  UC Medications - No data to display  Initial Impression / Assessment and Plan / UC Course  I have reviewed the triage vital signs and the nursing notes.  Pertinent labs & imaging results that were available during my care of the patient were reviewed by me and considered in my medical decision making (see chart for details).       UPT is negative. [place] is sent in.  CBC is drawn.  Will notify her if it significantly abnormal.  She is given contact information for GYN Final Clinical Impressions(s) / UC Diagnoses   Final diagnoses:  Abnormal uterine bleeding (AUB)     Discharge Instructions      The urine pregnancy test was negative  We have drawn a blood count to check for anemia.  Staff will notify you if it is abnormal  Mefenamic acid 250 mg--2 capsules by mouth 3 times daily for up to 3 days to make your vaginal bleeding better.      ED Prescriptions     Medication Sig Dispense Auth. Provider   Mefenamic Acid 250 MG CAPS Take 2 capsules (500 mg total) by mouth in the morning, at noon, and at bedtime for 5 days. 30 capsule Zenia Resides, MD      PDMP not reviewed this encounter.   Zenia Resides, MD 03/24/23 [phone]

## 2023-03-24 NOTE — Discharge Instructions (Signed)
The urine pregnancy test was negative  We have drawn a blood count to check for anemia.  Staff will notify you if it is abnormal  Mefenamic acid 250 mg--2 capsules by mouth 3 times daily for up to 3 days to make your vaginal bleeding better.

## 2023-03-24 NOTE — ED Triage Notes (Signed)
Reports vaginal bleeding that started May 19.  Initially bleeding very light, then one day of no bleeding and then very heavy since then.  Denies any pain.
